# Patient Record
Sex: Male | Born: 1985 | Race: White | Hispanic: No | Marital: Married | Smoking: Never smoker
Health system: Southern US, Community
[De-identification: ages and names within clinical notes are randomized; demographics above are authoritative.]

---

## 2013-02-25 ENCOUNTER — Other Ambulatory Visit: Payer: Self-pay | Admitting: Family Medicine

## 2013-02-25 DIAGNOSIS — E010 Iodine-deficiency related diffuse (endemic) goiter: Secondary | ICD-10-CM

## 2013-03-02 ENCOUNTER — Ambulatory Visit
Admission: RE | Admit: 2013-03-02 | Discharge: 2013-03-02 | Disposition: A | Discharge status: Home / Self Care | Payer: BC Managed Care – PPO | Source: Ambulatory Visit | Attending: Family Medicine | Admitting: Family Medicine

## 2013-03-02 DIAGNOSIS — E010 Iodine-deficiency related diffuse (endemic) goiter: Secondary | ICD-10-CM

## 2015-01-21 IMAGING — US US SOFT TISSUE HEAD/NECK
1 series · 14 of 25 positions shown · non-contrast
Comparison: None.

CLINICAL DATA: Thyromegaly

THYROID ULTRASOUND
TECHNIQUE: Ultrasound examination of the thyroid gland and adjacent
soft tissues was performed.

[Series 1: us soft tissue head/neck · 0.07mm/px · 14 of 32 slices shown]
[im 1/32]
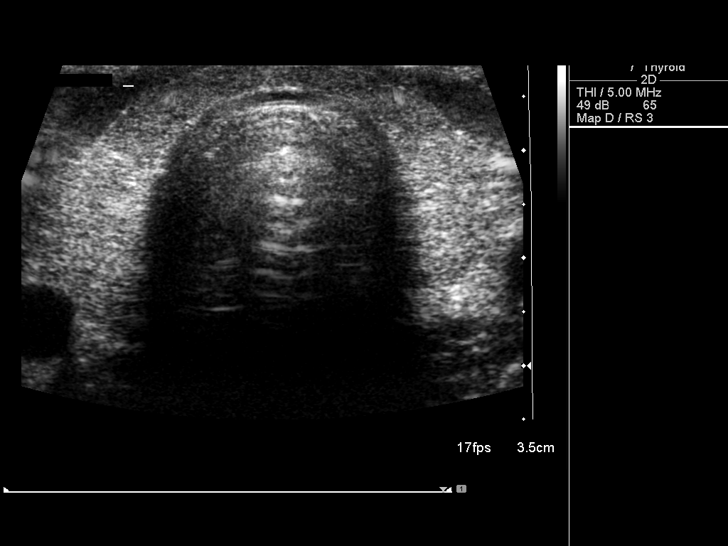
[im 3/32]
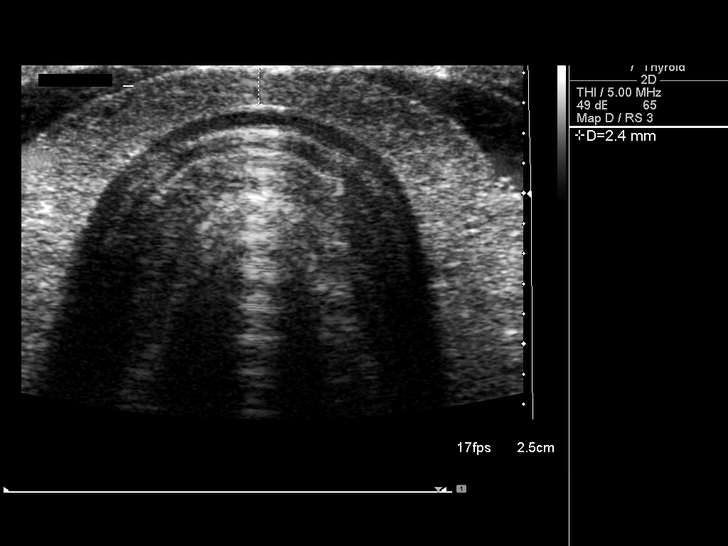
[im 6/32]
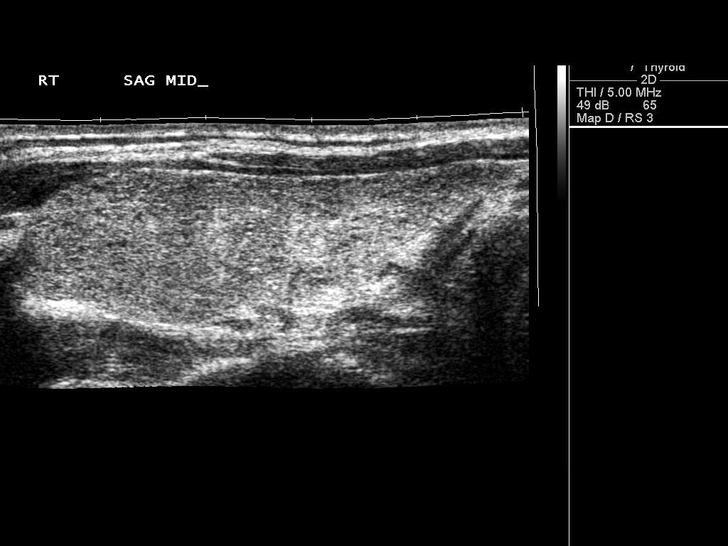
[im 8/32]
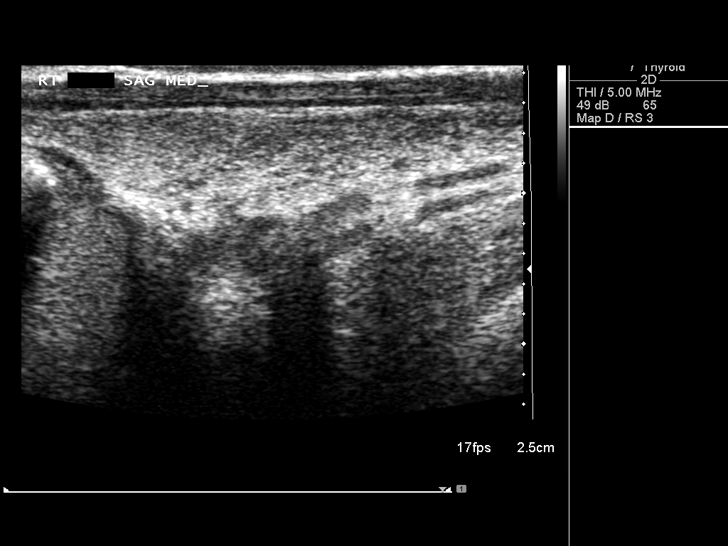
[im 11/32]
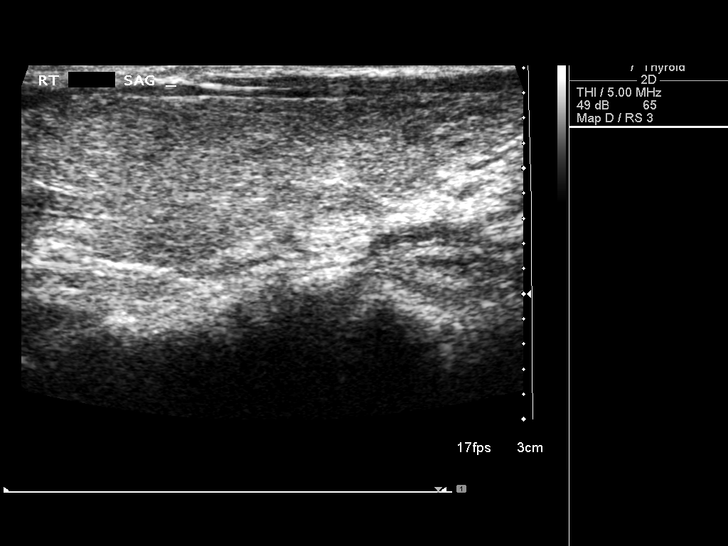
[im 12/32]
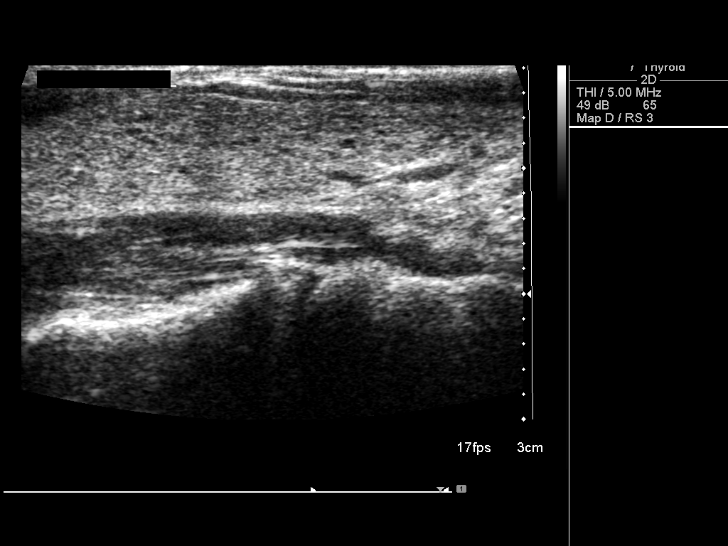
[im 15/32]
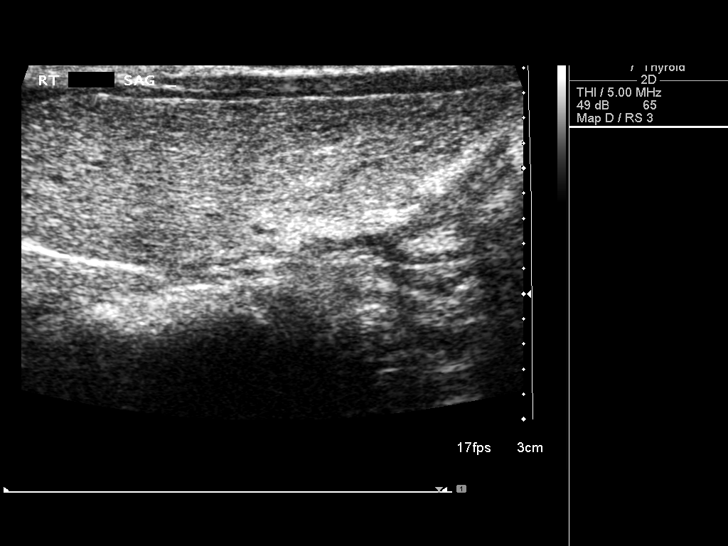
[im 17/32]
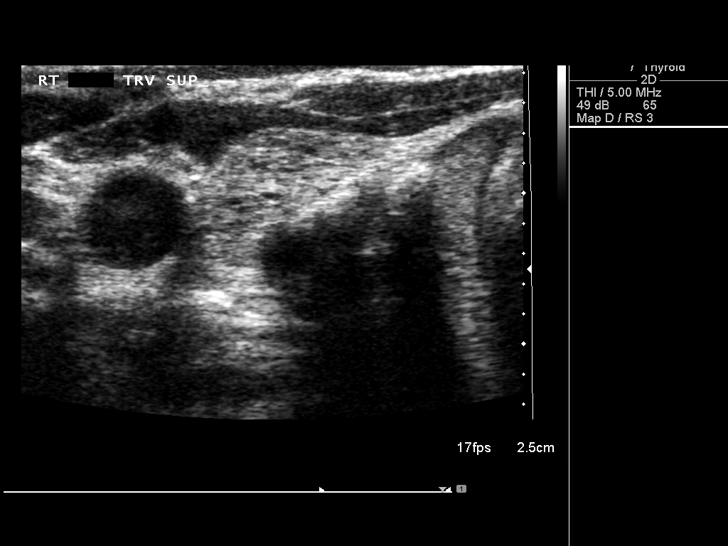
[im 20/32]
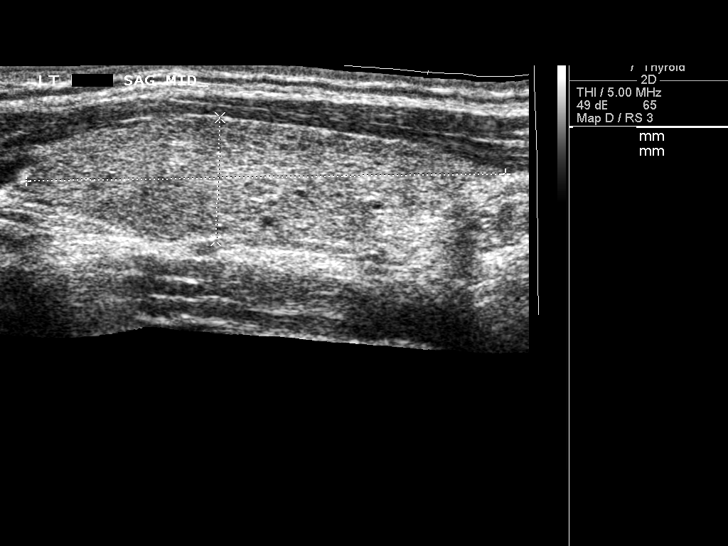
[im 21/32]
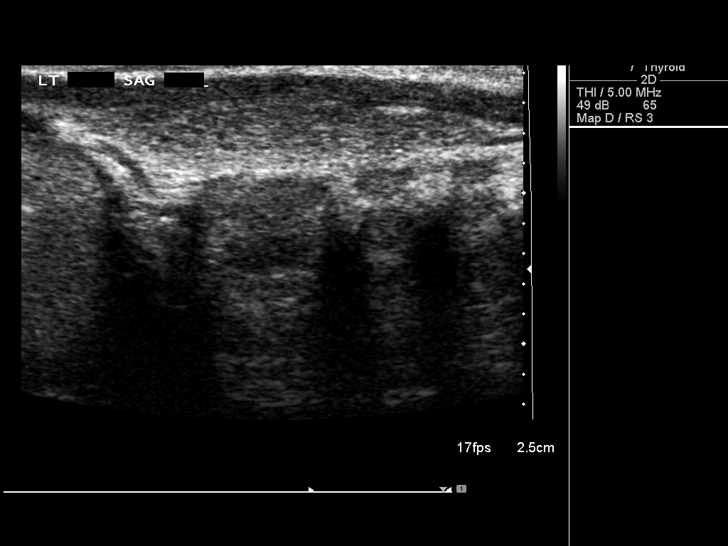
[im 24/32]
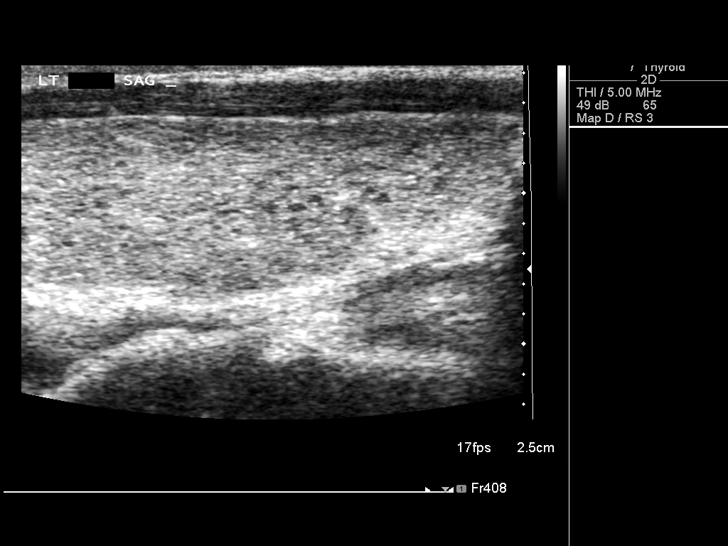
[im 26/32]
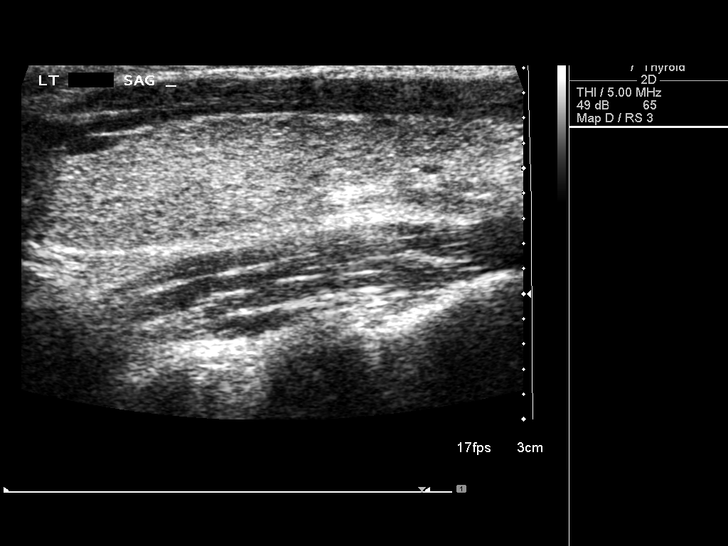
[im 29/32]
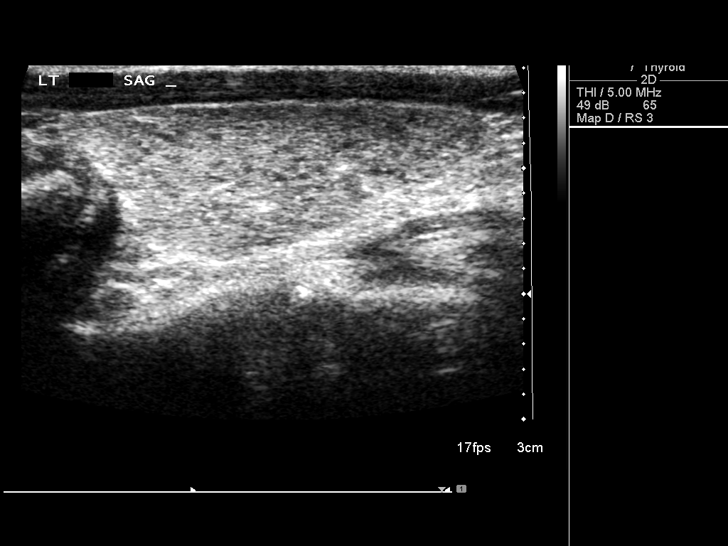
[im 32/32]
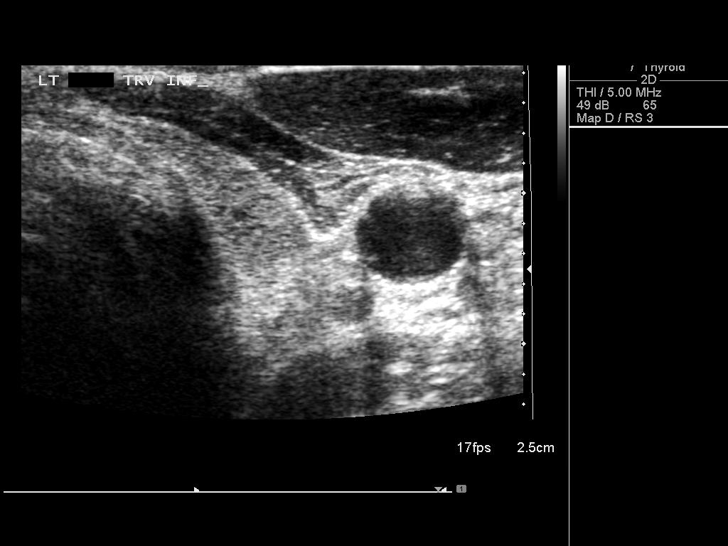

[14 of 25 positions shown; findings below may reference images not displayed]

FINDINGS: Right thyroid lobe:  Normal in size and echogenicity.  The right
lobe measures 4.8 cm x 1.5 cm x 1.6 cm.
Left thyroid lobe:  Normal in size and echogenicity.  The left lobe
measures 4.4 cm x 1.1 cm x 1.5 cm.
Isthmus:  Normal in thickness measuring 2 mm.

Focal nodules:  None

Lymphadenopathy:  None visualized.
IMPRESSION: Normal thyroid ultrasound.

## 2015-06-02 ENCOUNTER — Encounter (HOSPITAL_COMMUNITY): Payer: Self-pay | Admitting: *Deleted

## 2015-06-02 ENCOUNTER — Emergency Department (HOSPITAL_COMMUNITY)
Admission: EM | Admit: 2015-06-02 | Discharge: 2015-06-02 | Disposition: A | Discharge status: Home / Self Care | Payer: BLUE CROSS/BLUE SHIELD | Attending: Emergency Medicine | Admitting: Emergency Medicine

## 2015-06-02 ENCOUNTER — Emergency Department (HOSPITAL_COMMUNITY): Payer: BLUE CROSS/BLUE SHIELD

## 2015-06-02 DIAGNOSIS — K5900 Constipation, unspecified: Secondary | ICD-10-CM

## 2015-06-02 DIAGNOSIS — K602 Anal fissure, unspecified: Secondary | ICD-10-CM | POA: Diagnosis not present

## 2015-06-02 LAB — POC OCCULT BLOOD, ED: Fecal Occult Bld: NEGATIVE

## 2015-06-02 MED ORDER — MILK AND MOLASSES ENEMA
1.0000 | Freq: Once | RECTAL | Status: AC
  Administered 2015-06-02: 250 mL via RECTAL
  Filled 2015-06-02: qty 250

## 2015-06-02 MED ORDER — POLYETHYLENE GLYCOL 3350 17 GM/SCOOP PO POWD: 17.0000 g | Freq: Two times a day (BID) | ORAL | Status: AC

## 2015-06-02 NOTE — Discharge Instructions (Signed)
1. Medications: Miralax, usual home medications 2. Treatment: rest, drink plenty of fluids,  3. Follow Up: Please followup with your primary doctor in 2-3 days for discussion of your diagnoses and further evaluation after today's visit; if you do not have a primary care doctor use the resource guide provided to find one; Please return to the ER for worsening symptoms    Constipation, Adult Constipation is when a person has fewer than three bowel movements a week, has difficulty having a bowel movement, or has stools that are dry, hard, or larger than normal. As people grow older, constipation is more common. A low-fiber diet, not taking in enough fluids, and taking certain medicines may make constipation worse.  CAUSES   Certain medicines, such as antidepressants, pain medicine, iron supplements, antacids, and water pills.   Certain diseases, such as diabetes, irritable bowel syndrome (IBS), thyroid disease, or depression.   Not drinking enough water.   Not eating enough fiber-rich foods.   Stress or travel.   Lack of physical activity or exercise.   Ignoring the urge to have a bowel movement.   Using laxatives too much.  SIGNS AND SYMPTOMS   Having fewer than three bowel movements a week.   Straining to have a bowel movement.   Having stools that are hard, dry, or larger than normal.   Feeling full or bloated.   Pain in the lower abdomen.   Not feeling relief after having a bowel movement.  DIAGNOSIS  Your health care provider will take a medical history and perform a physical exam. Further testing may be done for severe constipation. Some tests may include:  A barium enema X-ray to examine your rectum, colon, and, sometimes, your small intestine.   A sigmoidoscopy to examine your lower colon.   A colonoscopy to examine your entire colon. TREATMENT  Treatment will depend on the severity of your constipation and what is causing it. Some dietary  treatments include drinking more fluids and eating more fiber-rich foods. Lifestyle treatments may include regular exercise. If these diet and lifestyle recommendations do not help, your health care provider may recommend taking over-the-counter laxative medicines to help you have bowel movements. Prescription medicines may be prescribed if over-the-counter medicines do not work.  HOME CARE INSTRUCTIONS   Eat foods that have a lot of fiber, such as fruits, vegetables, whole grains, and beans.  Limit foods high in fat and processed sugars, such as french fries, hamburgers, cookies, candies, and soda.   A fiber supplement may be added to your diet if you cannot get enough fiber from foods.   Drink enough fluids to keep your urine clear or pale yellow.   Exercise regularly or as directed by your health care provider.   Go to the restroom when you have the urge to go. Do not hold it.   Only take over-the-counter or prescription medicines as directed by your health care provider. Do not take other medicines for constipation without talking to your health care provider first.  SEEK IMMEDIATE MEDICAL CARE IF:   You have bright red blood in your stool.   Your constipation lasts for more than 4 days or gets worse.   You have abdominal or rectal pain.   You have thin, pencil-like stools.   You have unexplained weight loss. MAKE SURE YOU:   Understand these instructions.  Will watch your condition.  Will get help right away if you are not doing well or get worse.   This information is  not intended to replace advice given to you by your health care provider. Make sure you discuss any questions you have with your health care provider.   Document Released: 04/12/2004 Document Revised: 08/05/2014 Document Reviewed: 04/26/2013 Elsevier Interactive Patient Education [employer]2016 [employer]vier Inc.

## 2015-06-02 NOTE — ED Notes (Signed)
PA Muthersbaugh at bedside. 

## 2015-06-02 NOTE — ED Notes (Signed)
Patient transported to X-ray 

## 2015-06-02 NOTE — ED Notes (Signed)
Pt reports constipation, last BM two days ago. Has sensation that he needs to have bowel movement but unable and feels like there is "possible obstruction." no relief with stool softener this am.

## 2015-06-02 NOTE — ED Notes (Signed)
Patient advised he could only tolerate part of enema. This RN advised patient to hold content in as long as possible.

## 2015-06-02 NOTE — ED Notes (Signed)
Patient advising he felt like he could tolerate more of the enema at this time.

## 2015-06-02 NOTE — ED Provider Notes (Signed)
CSN: [id number]     Arrival date & time 06/02/15  1212 History   First MD Initiated Contact with Patient 06/02/15 1354     Chief Complaint  Patient presents with  . Constipation     (Consider location/radiation/quality/duration/timing/severity/associated sxs/prior Treatment) Patient is a 29 y.o. male presenting with constipation. The history is provided by the patient and medical records. No language interpreter was used.  Constipation Associated symptoms: no abdominal pain, no back pain, no diarrhea, no dysuria, no fever, no nausea and no vomiting      Randall Coombehillip Bayley is a 29 y.o. male  with no major medical problems presents to the Emergency Department complaining of gradual, persistent, progressively worsening constipation onset 3 days ago. He reports that when he strains for a BM he feels like there is an obstruction.  He reports pain at the anal opening when attempting to bear down.  Pt reports his last BM was 3 days ago.  He reports that BM was hard.  Pt reports he has been traveling and was not constipated while traveling.  Pt reports he was in [place] for 12 days. No N/V/D, fever, chills, headache, neck pain. Nothing makes it worse.  No anal sex, no insertion into the rectum.  Pt reports taking a stool softener this AM without relief.  Pt denies fever, chills, headache, neck pain, chest pain, shortness of breath, nausea, vomiting, diarrhea, weakness, dizziness, syncope.Marland Kitchen.     History reviewed. No pertinent past medical history. History reviewed. No pertinent past surgical history. History reviewed. No pertinent family history. Social History  Substance Use Topics  . Smoking status: Never Smoker   . Smokeless tobacco: None  . Alcohol Use: Yes     Comment: occ    Review of Systems  Constitutional: Negative for fever, diaphoresis, appetite change, fatigue and unexpected weight change.  HENT: Negative for mouth sores.   Eyes: Negative for visual disturbance.  Respiratory: Negative  for cough, chest tightness, shortness of breath and wheezing.   Cardiovascular: Negative for chest pain.  Gastrointestinal: Positive for constipation. Negative for nausea, vomiting, abdominal pain and diarrhea.  Endocrine: Negative for polydipsia, polyphagia and polyuria.  Genitourinary: Negative for dysuria, urgency, frequency and hematuria.  Musculoskeletal: Negative for back pain and neck stiffness.  Skin: Negative for rash.  Allergic/Immunologic: Negative for immunocompromised state.  Neurological: Negative for syncope, light-headedness and headaches.  Hematological: Does not bruise/bleed easily.  Psychiatric/Behavioral: Negative for sleep disturbance. The patient is not nervous/anxious.       Allergies  Review of patient's allergies indicates no known allergies.  Home Medications   Prior to Admission medications   Medication Sig Start Date End Date Taking? Authorizing Provider  ibuprofen (ADVIL,MOTRIN) 400 MG tablet Take 400 mg by mouth every 6 (six) hours as needed for fever or mild pain.   Yes Historical Provider, MD  minocycline (MINOCIN,DYNACIN) 100 MG capsule Take 100 mg by mouth 2 (two) times daily.   Yes Historical Provider, MD  Multiple Vitamins-Minerals (MULTIVITAMIN WITH MINERALS) tablet Take 1 tablet by mouth daily.   Yes Historical Provider, MD  polyethylene glycol powder (GLYCOLAX/MIRALAX) powder Take 17 g by mouth 2 (two) times daily. 06/02/15   Randall Callins, PA-C   BP 97/54 mmHg  Pulse 68  Temp(Src) 98.2 F (36.8 C) (Oral)  Resp 16  SpO2 96% Physical Exam  Constitutional: He appears well-developed and well-nourished. No distress.  Awake, alert, nontoxic appearance  HENT:  Head: Normocephalic and atraumatic.  Mouth/Throat: Oropharynx is clear and moist. No  oropharyngeal exudate.  Eyes: Conjunctivae are normal. No scleral icterus.  Neck: Normal range of motion. Neck supple.  Cardiovascular: Normal rate, regular rhythm, normal heart sounds and intact  distal pulses.   No murmur heard. Pulmonary/Chest: Effort normal and breath sounds normal. No respiratory distress. He has no wheezes.  Equal chest expansion  Abdominal: Soft. Bowel sounds are normal. He exhibits no distension and no mass. There is no tenderness. There is no rebound and no guarding. Hernia confirmed negative in the right inguinal area and confirmed negative in the left inguinal area.  Genitourinary: Testes normal and penis normal. Rectal exam shows fissure. Rectal exam shows no external hemorrhoid and no tenderness. Guaiac negative stool. Prostate is not enlarged and not tender.     Impacted stool in the rectum  Musculoskeletal: Normal range of motion. He exhibits no edema.  Lymphadenopathy:       Right: No inguinal adenopathy present.       Left: No inguinal adenopathy present.  Neurological: He is alert.  Speech is clear and goal oriented Moves extremities without ataxia  Skin: Skin is warm and dry. He is not diaphoretic.  Psychiatric: He has a normal mood and affect.  Nursing note and vitals reviewed.   ED Course  Fecal disimpaction Date/Time: 06/02/2015 3:06 PM Performed by: Dierdre Forth Authorized by: Dierdre Forth Consent: Verbal consent obtained. Risks and benefits: risks, benefits and alternatives were discussed Consent given by: patient Patient understanding: patient states understanding of the procedure being performed Patient consent: the patient's understanding of the procedure matches consent given Procedure consent: procedure consent matches procedure scheduled Relevant documents: relevant documents present and verified Site marked: the operative site was marked Required items: required blood products, implants, devices, and special equipment available Patient identity confirmed: verbally with patient and arm band Time out: Immediately prior to procedure a "time out" was called to verify the correct patient, procedure, equipment,  support staff and site/side marked as required. Preparation: Patient was prepped and draped in the usual sterile fashion. Local anesthesia used: no Patient sedated: no Patient tolerance: Patient tolerated the procedure well with no immediate complications Comments: Small amount of stool removed.     (including critical care time) Labs Review Labs Reviewed  POC OCCULT BLOOD, ED    Imaging Review Dg Abd Acute W/chest  06/02/2015  CLINICAL DATA:  Constipation, no bowel movement for 2 days. EXAM: DG ABDOMEN ACUTE W/ 1V CHEST COMPARISON:  None. FINDINGS: Normal heart size and vascularity. Lungs remain clear. No focal pneumonia, collapse or consolidation. No effusion or pneumothorax. Trachea is midline. No acute osseous finding. Negative for free air. Large volume of retained stool in the left colon and rectum. No significant obstruction pattern or ileus. No abnormal calcification or osseous finding. IMPRESSION: Large volume retained stool in the left colon and rectum compatible with constipation. No other acute process. Electronically Signed   By: Judie Petit.  Shick M.D.   On: 06/02/2015 15:23   I have personally reviewed and evaluated these images and lab results as part of my medical decision-making.   EKG Interpretation None      MDM   Final diagnoses:  Constipation, unspecified constipation type  Anal fissure   Randall Nichols presents with constipation x 3 days.  Impacted stool in the rectum. Small amount removed with fecal disimpaction.   3:29 PM Acute abdominal series with large-volume retained stool in the left colon and rectum consistent with constipation. No evidence of bowel obstruction. Abdomen is soft on exam without guarding  or rebound.   4:28PM Pt given enema and will plan for d/c home with miralax.  Discussed bowel hygiene and conservative therapies for constipation.      Dahlia Client Simora Dingee, PA-C 06/03/15 0730  Azalia Bilis, MD 06/08/15 [phone]

## 2015-06-02 NOTE — ED Notes (Signed)
Patient reporting large bowel movement, stating he feels relief from the enema.

## 2015-06-05 ENCOUNTER — Emergency Department (INDEPENDENT_AMBULATORY_CARE_PROVIDER_SITE_OTHER)
Admission: EM | Admit: 2015-06-05 | Discharge: 2015-06-05 | Disposition: A | Discharge status: Home / Self Care | Payer: BLUE CROSS/BLUE SHIELD | Source: Home / Self Care | Attending: Family Medicine | Admitting: Family Medicine

## 2015-06-05 ENCOUNTER — Encounter (HOSPITAL_COMMUNITY): Payer: Self-pay | Admitting: *Deleted

## 2015-06-05 DIAGNOSIS — K5901 Slow transit constipation: Secondary | ICD-10-CM

## 2015-06-05 MED ORDER — PEG 3350-KCL-NABCB-NACL-NASULF 236 G PO SOLR: 4000.0000 mL | Freq: Once | ORAL | Status: AC

## 2015-06-05 NOTE — ED Provider Notes (Signed)
CSN: [id number]     Arrival date & time 06/05/15  1300 History   First MD Initiated Contact with Patient 06/05/15 1310     Chief Complaint  Patient presents with  . Constipation   (Consider location/radiation/quality/duration/timing/severity/associated sxs/prior Treatment) Patient is a 29 y.o. male presenting with constipation. The history is provided by the patient.  Constipation Severity:  Moderate Time since last bowel movement:  3 days Progression:  Unchanged Chronicity:  New Context comment:  Seen in ER 11/5 and eval for same, no resolution after care, here for further rx. Associated symptoms: no nausea and no vomiting     History reviewed. No pertinent past medical history. History reviewed. No pertinent past surgical history. History reviewed. No pertinent family history. Social History  Substance Use Topics  . Smoking status: Never Smoker   . Smokeless tobacco: None  . Alcohol Use: Yes     Comment: occ    Review of Systems  Constitutional: Positive for appetite change.       Nothing to eat for 18hrs.  Gastrointestinal: Positive for constipation and blood in stool. Negative for nausea and vomiting.       Min after ER procedure  All other systems reviewed and are negative.   Allergies  Review of patient's allergies indicates no known allergies.  Home Medications   Prior to Admission medications   Medication Sig Start Date End Date Taking? Authorizing Provider  ibuprofen (ADVIL,MOTRIN) 400 MG tablet Take 400 mg by mouth every 6 (six) hours as needed for fever or mild pain.    Historical Provider, MD  minocycline (MINOCIN,DYNACIN) 100 MG capsule Take 100 mg by mouth 2 (two) times daily.    Historical Provider, MD  Multiple Vitamins-Minerals (MULTIVITAMIN WITH MINERALS) tablet Take 1 tablet by mouth daily.    Historical Provider, MD  polyethylene glycol (GOLYTELY) 236 G solution Take 4,000 mLs by mouth once. 06/05/15   Linna HoffJames D Kindl, MD  polyethylene glycol powder  (GLYCOLAX/MIRALAX) powder Take 17 g by mouth 2 (two) times daily. 06/02/15   Hannah Muthersbaugh, PA-C   Meds Ordered and Administered this Visit  Medications - No data to display  BP 129/80 mmHg  Pulse 57  Temp(Src) 97.6 F (36.4 C) (Oral)  Resp 16  SpO2 98% No data found.   Physical Exam  Constitutional: He is oriented to person, place, and time. He appears well-developed and well-nourished. No distress.  Neck: Normal range of motion. Neck supple.  Cardiovascular: Normal heart sounds.   Pulmonary/Chest: Effort normal and breath sounds normal.  Abdominal: Soft. Bowel sounds are normal. He exhibits no distension and no mass. There is no tenderness. There is no rebound and no guarding.  Lymphadenopathy:    He has no cervical adenopathy.  Neurological: He is alert and oriented to person, place, and time.  Skin: Skin is warm and dry.  Nursing note and vitals reviewed.   ED Course  Procedures (including critical care time)  Labs Review Labs Reviewed - No data to display  Imaging Review No results found.   Visual Acuity Review  Right Eye Distance:   Left Eye Distance:   Bilateral Distance:    Right Eye Near:   Left Eye Near:    Bilateral Near:         MDM   1. Constipation by delayed colonic transit        Linna HoffJames D Kindl, MD 06/05/15 1331

## 2015-06-05 NOTE — Discharge Instructions (Signed)
Take all of medicine and stat on high fiber diet and lots of fluids.

## 2015-06-05 NOTE — ED Notes (Signed)
Pt  Has    questians      About plan of  Care   Dr  Zollie Scalekindl  Spoke  With pt

## 2015-06-05 NOTE — ED Notes (Signed)
Pt  Reports   Was   Seen  Er     3  Days  Ago for  Constipation          He  States  At that time  He  Was  Given  An  Enema      And   Got  Some  Releif  He  States  He  Has  Not  Had  A   bm  Since  And  He   Has   Had  Had  Some  abd  Pain          denys  Any vomiting

## 2016-01-02 DIAGNOSIS — L718 Other rosacea: Secondary | ICD-10-CM | POA: Diagnosis not present

## 2016-01-02 DIAGNOSIS — D229 Melanocytic nevi, unspecified: Secondary | ICD-10-CM | POA: Diagnosis not present

## 2016-01-02 DIAGNOSIS — L814 Other melanin hyperpigmentation: Secondary | ICD-10-CM | POA: Diagnosis not present

## 2016-09-09 DIAGNOSIS — D1723 Benign lipomatous neoplasm of skin and subcutaneous tissue of right leg: Secondary | ICD-10-CM | POA: Diagnosis not present

## 2016-09-09 DIAGNOSIS — Z0000 Encounter for general adult medical examination without abnormal findings: Secondary | ICD-10-CM | POA: Diagnosis not present

## 2016-10-04 DIAGNOSIS — Z131 Encounter for screening for diabetes mellitus: Secondary | ICD-10-CM | POA: Diagnosis not present

## 2016-12-31 DIAGNOSIS — L719 Rosacea, unspecified: Secondary | ICD-10-CM | POA: Diagnosis not present

## 2017-04-04 DIAGNOSIS — N50811 Right testicular pain: Secondary | ICD-10-CM | POA: Diagnosis not present

## 2017-04-04 DIAGNOSIS — N50819 Testicular pain, unspecified: Secondary | ICD-10-CM | POA: Diagnosis not present

## 2017-04-22 IMAGING — DX DG ABDOMEN ACUTE W/ 1V CHEST
3 series · 3 of 3 positions shown · non-contrast
Comparison: None.

CLINICAL DATA: Constipation, no bowel movement for 2 days.

EXAM:
DG ABDOMEN ACUTE W/ 1V CHEST

[chest pa]
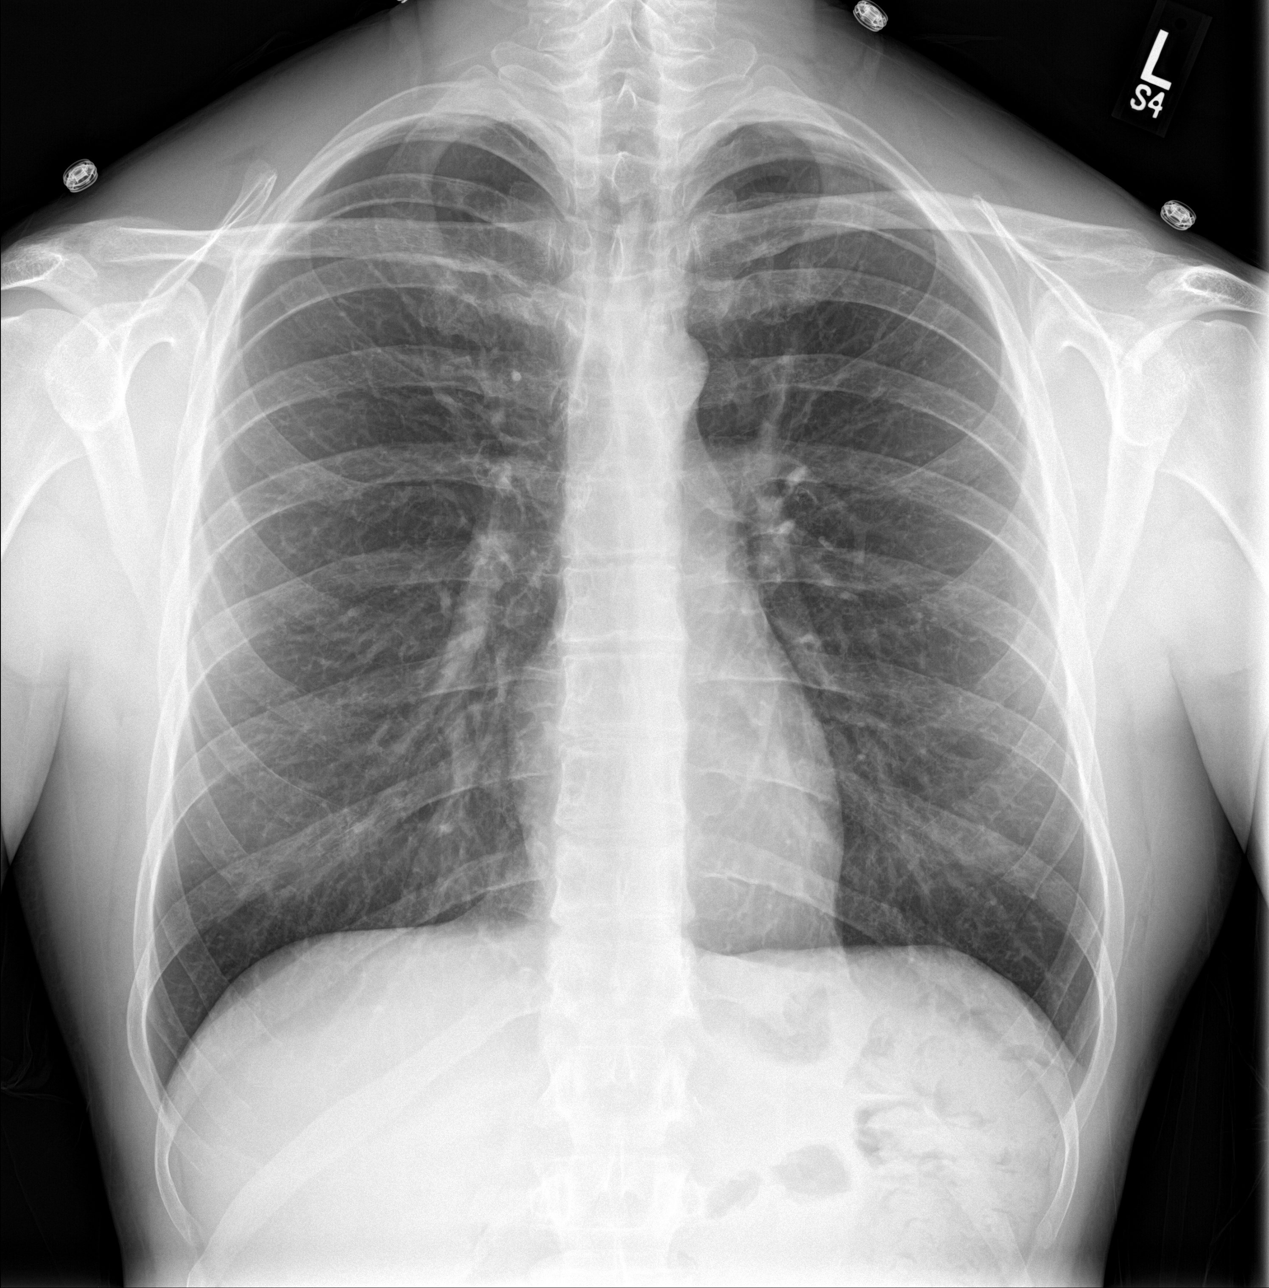

[abdomen erect]
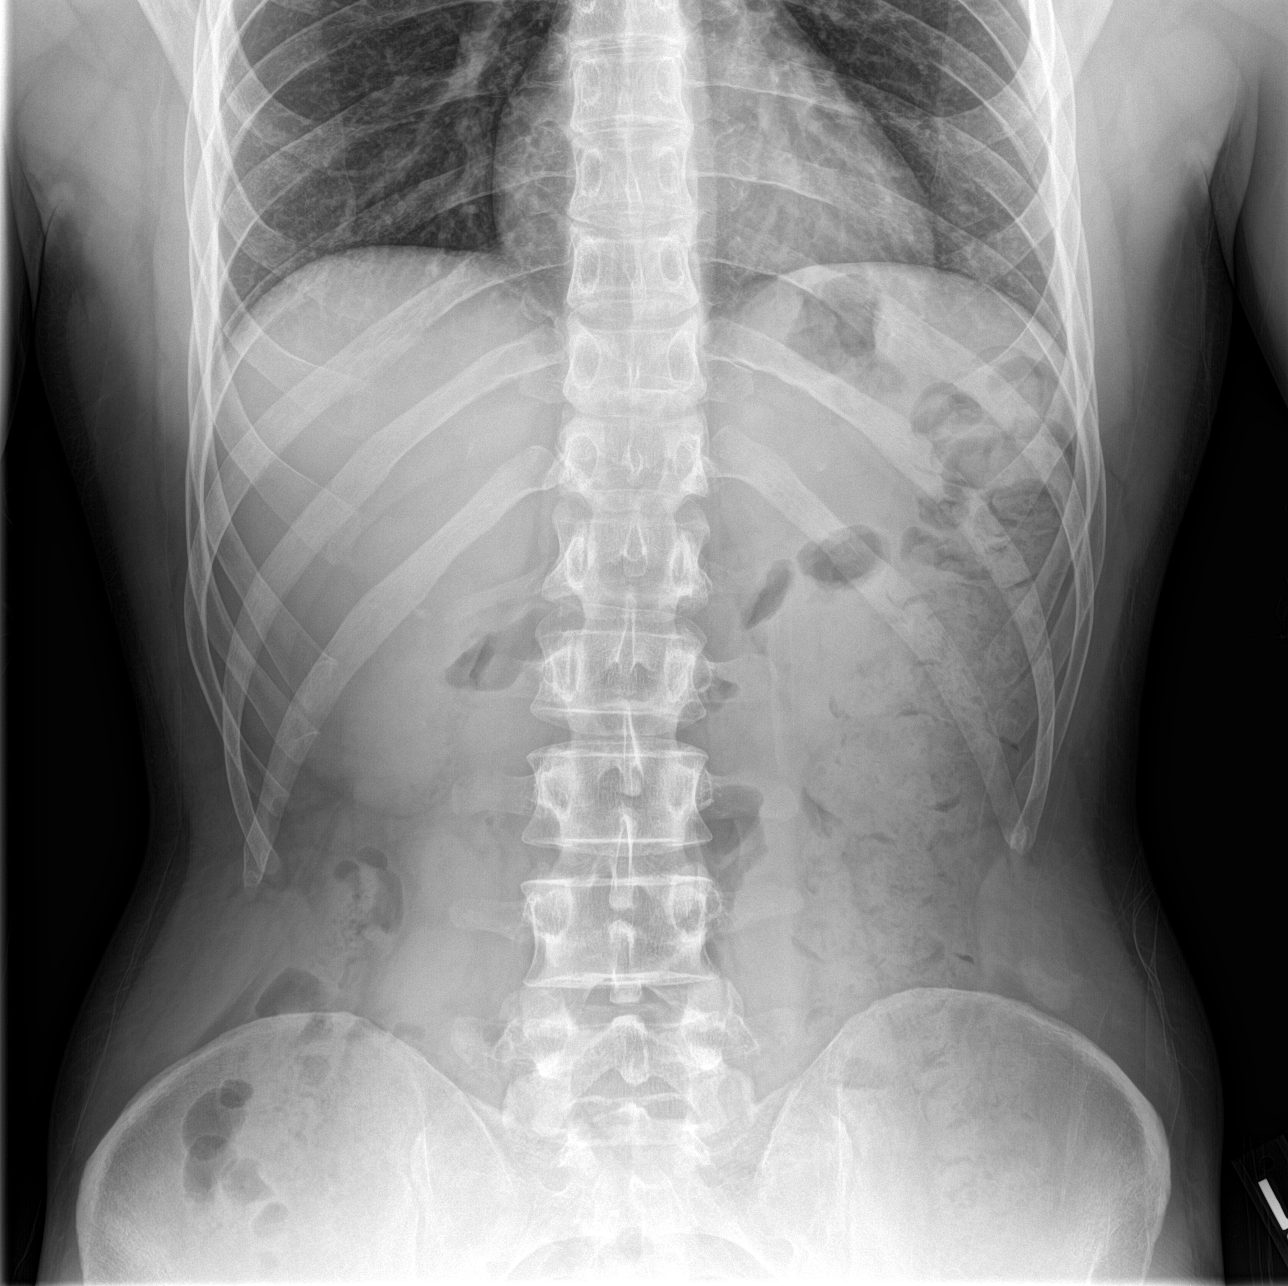

[abdomen supine]
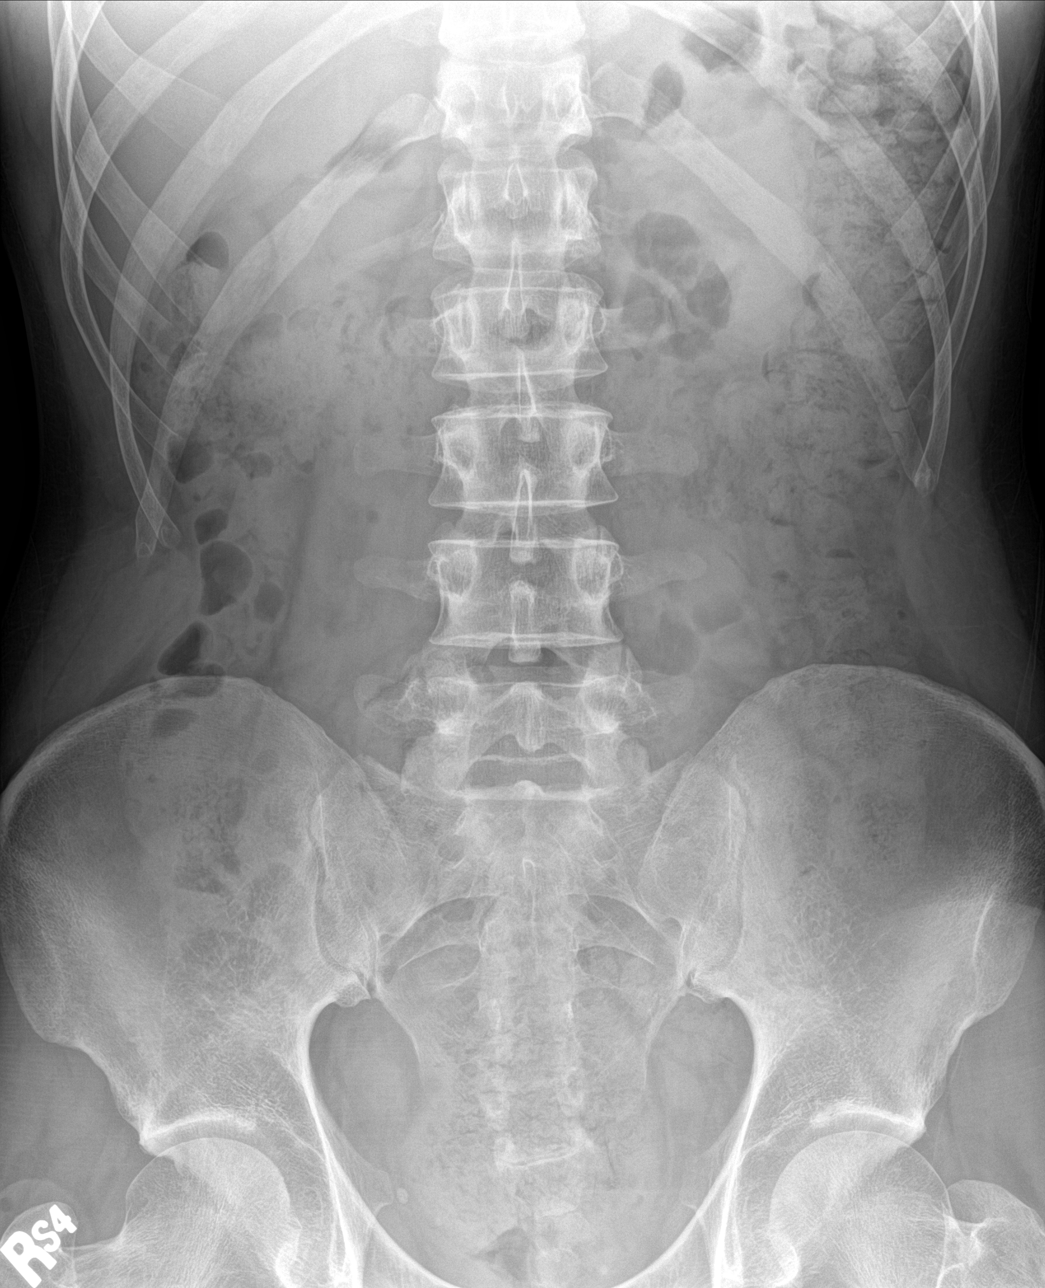

[3 of 3 positions shown; findings below may reference images not displayed]

FINDINGS: Normal heart size and vascularity. Lungs remain clear. No focal
pneumonia, collapse or consolidation. No effusion or pneumothorax.
Trachea is midline. No acute osseous finding.

Negative for free air. Large volume of retained stool in the left
colon and rectum. No significant obstruction pattern or ileus. No
abnormal calcification or osseous finding.
IMPRESSION: Large volume retained stool in the left colon and rectum compatible
with constipation. No other acute process.

## 2017-05-20 DIAGNOSIS — R109 Unspecified abdominal pain: Secondary | ICD-10-CM | POA: Diagnosis not present

## 2017-05-20 DIAGNOSIS — N50819 Testicular pain, unspecified: Secondary | ICD-10-CM | POA: Diagnosis not present

## 2017-09-11 DIAGNOSIS — Z0000 Encounter for general adult medical examination without abnormal findings: Secondary | ICD-10-CM | POA: Diagnosis not present

## 2017-10-01 DIAGNOSIS — Z13220 Encounter for screening for lipoid disorders: Secondary | ICD-10-CM | POA: Diagnosis not present

## 2017-10-01 DIAGNOSIS — Z0000 Encounter for general adult medical examination without abnormal findings: Secondary | ICD-10-CM | POA: Diagnosis not present

## 2018-01-08 DIAGNOSIS — L719 Rosacea, unspecified: Secondary | ICD-10-CM | POA: Diagnosis not present

## 2018-01-08 DIAGNOSIS — L814 Other melanin hyperpigmentation: Secondary | ICD-10-CM | POA: Diagnosis not present

## 2018-01-08 DIAGNOSIS — D229 Melanocytic nevi, unspecified: Secondary | ICD-10-CM | POA: Diagnosis not present

## 2018-08-03 DIAGNOSIS — B399 Histoplasmosis, unspecified: Secondary | ICD-10-CM | POA: Diagnosis not present

## 2019-05-25 DIAGNOSIS — D229 Melanocytic nevi, unspecified: Secondary | ICD-10-CM | POA: Diagnosis not present

## 2019-05-25 DIAGNOSIS — Z1283 Encounter for screening for malignant neoplasm of skin: Secondary | ICD-10-CM | POA: Diagnosis not present

## 2020-06-13 DIAGNOSIS — L918 Other hypertrophic disorders of the skin: Secondary | ICD-10-CM | POA: Diagnosis not present

## 2020-06-13 DIAGNOSIS — D1801 Hemangioma of skin and subcutaneous tissue: Secondary | ICD-10-CM | POA: Diagnosis not present

## 2020-06-13 DIAGNOSIS — Z1283 Encounter for screening for malignant neoplasm of skin: Secondary | ICD-10-CM | POA: Diagnosis not present

## 2020-06-27 DIAGNOSIS — Z23 Encounter for immunization: Secondary | ICD-10-CM | POA: Diagnosis not present

## 2020-06-27 DIAGNOSIS — Z0000 Encounter for general adult medical examination without abnormal findings: Secondary | ICD-10-CM | POA: Diagnosis not present

## 2020-06-27 DIAGNOSIS — R7989 Other specified abnormal findings of blood chemistry: Secondary | ICD-10-CM | POA: Diagnosis not present

## 2020-07-03 DIAGNOSIS — R7989 Other specified abnormal findings of blood chemistry: Secondary | ICD-10-CM | POA: Diagnosis not present

## 2020-08-29 DIAGNOSIS — Z131 Encounter for screening for diabetes mellitus: Secondary | ICD-10-CM | POA: Diagnosis not present

## 2020-08-29 DIAGNOSIS — Z13220 Encounter for screening for lipoid disorders: Secondary | ICD-10-CM | POA: Diagnosis not present

## 2020-08-29 DIAGNOSIS — R7989 Other specified abnormal findings of blood chemistry: Secondary | ICD-10-CM | POA: Diagnosis not present

## 2020-11-14 ENCOUNTER — Ambulatory Visit (HOSPITAL_COMMUNITY)
Admission: EM | Admit: 2020-11-14 | Discharge: 2020-11-14 | Disposition: A | Discharge status: Home / Self Care | Payer: BC Managed Care – PPO | Attending: Family Medicine | Admitting: Family Medicine

## 2020-11-14 ENCOUNTER — Encounter (HOSPITAL_COMMUNITY): Payer: Self-pay | Admitting: Emergency Medicine

## 2020-11-14 ENCOUNTER — Other Ambulatory Visit: Payer: Self-pay

## 2020-11-14 DIAGNOSIS — S81811A Laceration without foreign body, right lower leg, initial encounter: Secondary | ICD-10-CM | POA: Diagnosis not present

## 2020-11-14 NOTE — ED Triage Notes (Signed)
Laceration to right lower, anterior leg.  Stepped on a piece of playground equipment, foot slipped and injured leg.  Incident occurred today

## 2020-11-15 NOTE — ED Provider Notes (Signed)
  [facility]   [id number] 11/14/20 Arrival Time: 1948  ASSESSMENT & PLAN:  1. Leg laceration, right, initial encounter     Procedure: Verbal consent obtained. Patient provided with risks and alternatives to the procedure. Wound copiously irrigated with NS then cleansed with betadine. Local anesthesia: Lidocaine 1% with epinephrine. Wound carefully explored. No foreign body, tendon injury, or nonviable tissue were noted. Using sterile technique, 4 interrupted 4-0 Prolene sutures were placed to reapproximate the wound. Procedure tolerated well. No complications. Minimal bleeding. Advised to look for and return for any signs of infection such as redness, swelling, discharge, or worsening pain. Return for suture removal in 7-10 days.  See AVS for d/c information.  Reviewed expectations re: course of current medical issues. Questions answered. Outlined signs and symptoms indicating need for more acute intervention. Patient verbalized understanding. After Visit Summary given.   SUBJECTIVE:  Randall Nichols is a 35 y.o. male who presents with a laceration of anterior RLE. Today. Hit against metal playground equip. Bleeding controlled. No extremity sensation changes or weakness. Ambulatory without difficulty. Td UTD: Yes.  OBJECTIVE:  Vitals:   11/14/20 2030  BP: 128/74  Pulse: (!) 53  Resp: 18  Temp: 97.8 F (36.6 C)  TempSrc: Oral  SpO2: 98%     General appearance: alert; no distress Skin: linear laceration of anterior lower RLE; size: approx 2 cm; clean wound edges, no foreign bodies; without active bleeding Psychological: alert and cooperative; normal mood and affect   No Known Allergies  History reviewed. No pertinent past medical history. Social History   Socioeconomic History  . Marital status: Married    Spouse name: Not on file  . Number of children: Not on file  . Years of education: Not on file  . Highest education level: Not on file  Occupational  History  . Not on file  Tobacco Use  . Smoking status: Never Smoker  . Smokeless tobacco: Never Used  Vaping Use  . Vaping Use: Never used  Substance and Sexual Activity  . Alcohol use: Yes    Comment: occ  . Drug use: No  . Sexual activity: Not on file  Other Topics Concern  . Not on file  Social History Narrative  . Not on file   Social Determinants of Health   Financial Resource Strain: Not on file  Food Insecurity: Not on file  Transportation Needs: Not on file  Physical Activity: Not on file  Stress: Not on file  Social Connections: Not on file         Mardella Layman, MD 11/15/20 [phone]

## 2020-11-22 ENCOUNTER — Encounter (HOSPITAL_COMMUNITY): Payer: Self-pay

## 2020-11-22 ENCOUNTER — Other Ambulatory Visit: Payer: Self-pay

## 2020-11-22 ENCOUNTER — Ambulatory Visit (HOSPITAL_COMMUNITY)
Admission: RE | Admit: 2020-11-22 | Discharge: 2020-11-22 | Disposition: A | Discharge status: Home / Self Care | Payer: BC Managed Care – PPO | Source: Ambulatory Visit

## 2020-11-22 NOTE — ED Notes (Signed)
Wound evaluated by Dr. Tracie Harrier before suture removal.

## 2020-11-22 NOTE — ED Triage Notes (Signed)
Pt presents for suture removal in the right lower leg. 4 suture were placed 1 week ago. Pt reports some tightness and redness around the wound.  Pt reports hew had fever the day after the suture, he think can be related to a "cold" as his wife was sick.

## 2020-12-25 DIAGNOSIS — J329 Chronic sinusitis, unspecified: Secondary | ICD-10-CM | POA: Diagnosis not present

## 2020-12-25 DIAGNOSIS — J3489 Other specified disorders of nose and nasal sinuses: Secondary | ICD-10-CM | POA: Diagnosis not present

## 2020-12-25 DIAGNOSIS — Z20822 Contact with and (suspected) exposure to covid-19: Secondary | ICD-10-CM | POA: Diagnosis not present

## 2020-12-25 DIAGNOSIS — R509 Fever, unspecified: Secondary | ICD-10-CM | POA: Diagnosis not present

## 2020-12-25 DIAGNOSIS — R059 Cough, unspecified: Secondary | ICD-10-CM | POA: Diagnosis not present

## 2020-12-25 DIAGNOSIS — Z1152 Encounter for screening for COVID-19: Secondary | ICD-10-CM | POA: Diagnosis not present

## 2021-06-12 DIAGNOSIS — L821 Other seborrheic keratosis: Secondary | ICD-10-CM | POA: Diagnosis not present

## 2021-06-12 DIAGNOSIS — L814 Other melanin hyperpigmentation: Secondary | ICD-10-CM | POA: Diagnosis not present

## 2021-06-12 DIAGNOSIS — Z1283 Encounter for screening for malignant neoplasm of skin: Secondary | ICD-10-CM | POA: Diagnosis not present

## 2021-06-15 DIAGNOSIS — Z23 Encounter for immunization: Secondary | ICD-10-CM | POA: Diagnosis not present

## 2021-07-17 DIAGNOSIS — Z13220 Encounter for screening for lipoid disorders: Secondary | ICD-10-CM | POA: Diagnosis not present

## 2021-07-17 DIAGNOSIS — Z0000 Encounter for general adult medical examination without abnormal findings: Secondary | ICD-10-CM | POA: Diagnosis not present

## 2021-08-23 DIAGNOSIS — R7989 Other specified abnormal findings of blood chemistry: Secondary | ICD-10-CM | POA: Diagnosis not present

## 2021-08-23 DIAGNOSIS — E291 Testicular hypofunction: Secondary | ICD-10-CM | POA: Diagnosis not present

## 2021-09-18 DIAGNOSIS — J019 Acute sinusitis, unspecified: Secondary | ICD-10-CM | POA: Diagnosis not present

## 2022-06-13 DIAGNOSIS — L821 Other seborrheic keratosis: Secondary | ICD-10-CM | POA: Diagnosis not present

## 2022-06-13 DIAGNOSIS — L814 Other melanin hyperpigmentation: Secondary | ICD-10-CM | POA: Diagnosis not present

## 2022-06-13 DIAGNOSIS — Z1283 Encounter for screening for malignant neoplasm of skin: Secondary | ICD-10-CM | POA: Diagnosis not present

## 2022-08-07 DIAGNOSIS — Z0000 Encounter for general adult medical examination without abnormal findings: Secondary | ICD-10-CM | POA: Diagnosis not present

## 2022-08-07 DIAGNOSIS — R7989 Other specified abnormal findings of blood chemistry: Secondary | ICD-10-CM | POA: Diagnosis not present

## 2022-08-07 DIAGNOSIS — Z13220 Encounter for screening for lipoid disorders: Secondary | ICD-10-CM | POA: Diagnosis not present

## 2022-08-07 DIAGNOSIS — E291 Testicular hypofunction: Secondary | ICD-10-CM | POA: Diagnosis not present

## 2022-11-19 DIAGNOSIS — Z1283 Encounter for screening for malignant neoplasm of skin: Secondary | ICD-10-CM | POA: Diagnosis not present

## 2022-11-19 DIAGNOSIS — L728 Other follicular cysts of the skin and subcutaneous tissue: Secondary | ICD-10-CM | POA: Diagnosis not present

## 2022-11-19 DIAGNOSIS — L821 Other seborrheic keratosis: Secondary | ICD-10-CM | POA: Diagnosis not present

## 2022-11-19 DIAGNOSIS — D229 Melanocytic nevi, unspecified: Secondary | ICD-10-CM | POA: Diagnosis not present

## 2023-03-14 DIAGNOSIS — R109 Unspecified abdominal pain: Secondary | ICD-10-CM | POA: Diagnosis not present

## 2023-05-20 DIAGNOSIS — K648 Other hemorrhoids: Secondary | ICD-10-CM | POA: Diagnosis not present

## 2023-08-12 DIAGNOSIS — L700 Acne vulgaris: Secondary | ICD-10-CM | POA: Diagnosis not present

## 2023-08-14 DIAGNOSIS — Z13220 Encounter for screening for lipoid disorders: Secondary | ICD-10-CM | POA: Diagnosis not present

## 2023-08-14 DIAGNOSIS — K648 Other hemorrhoids: Secondary | ICD-10-CM | POA: Diagnosis not present

## 2023-08-14 DIAGNOSIS — E291 Testicular hypofunction: Secondary | ICD-10-CM | POA: Diagnosis not present

## 2023-08-14 DIAGNOSIS — D696 Thrombocytopenia, unspecified: Secondary | ICD-10-CM | POA: Diagnosis not present

## 2023-08-14 DIAGNOSIS — Z0000 Encounter for general adult medical examination without abnormal findings: Secondary | ICD-10-CM | POA: Diagnosis not present

## 2023-08-22 DIAGNOSIS — D696 Thrombocytopenia, unspecified: Secondary | ICD-10-CM | POA: Diagnosis not present

## 2023-08-22 DIAGNOSIS — K921 Melena: Secondary | ICD-10-CM | POA: Diagnosis not present

## 2023-08-27 DIAGNOSIS — R7989 Other specified abnormal findings of blood chemistry: Secondary | ICD-10-CM | POA: Diagnosis not present

## 2023-08-27 DIAGNOSIS — Z23 Encounter for immunization: Secondary | ICD-10-CM | POA: Diagnosis not present

## 2023-08-27 DIAGNOSIS — E291 Testicular hypofunction: Secondary | ICD-10-CM | POA: Diagnosis not present

## 2023-10-14 DIAGNOSIS — K921 Melena: Secondary | ICD-10-CM | POA: Diagnosis not present

## 2023-10-14 DIAGNOSIS — D123 Benign neoplasm of transverse colon: Secondary | ICD-10-CM | POA: Diagnosis not present

## 2023-10-14 DIAGNOSIS — K6289 Other specified diseases of anus and rectum: Secondary | ICD-10-CM | POA: Diagnosis not present

## 2024-03-09 DIAGNOSIS — E611 Iron deficiency: Secondary | ICD-10-CM | POA: Diagnosis not present

## 2024-03-09 DIAGNOSIS — E559 Vitamin D deficiency, unspecified: Secondary | ICD-10-CM | POA: Diagnosis not present

## 2024-03-09 DIAGNOSIS — E291 Testicular hypofunction: Secondary | ICD-10-CM | POA: Diagnosis not present

## 2024-03-09 DIAGNOSIS — E538 Deficiency of other specified B group vitamins: Secondary | ICD-10-CM | POA: Diagnosis not present

## 2024-03-15 DIAGNOSIS — H31002 Unspecified chorioretinal scars, left eye: Secondary | ICD-10-CM | POA: Diagnosis not present

## 2024-08-27 ENCOUNTER — Emergency Department (HOSPITAL_COMMUNITY)
Admission: EM | Admit: 2024-08-27 | Discharge: 2024-08-27 | Disposition: A | Discharge status: Home / Self Care | Attending: Emergency Medicine | Admitting: Emergency Medicine

## 2024-08-27 ENCOUNTER — Other Ambulatory Visit: Payer: Self-pay

## 2024-08-27 ENCOUNTER — Emergency Department (HOSPITAL_COMMUNITY)

## 2024-08-27 DIAGNOSIS — R001 Bradycardia, unspecified: Secondary | ICD-10-CM | POA: Insufficient documentation

## 2024-08-27 DIAGNOSIS — R002 Palpitations: Secondary | ICD-10-CM | POA: Insufficient documentation

## 2024-08-27 LAB — CBC
HCT: 42.6 % (ref 39.0–52.0)
Hemoglobin: 14.7 g/dL (ref 13.0–17.0)
MCH: 33 pg (ref 26.0–34.0)
MCHC: 34.5 g/dL (ref 30.0–36.0)
MCV: 95.5 fL (ref 80.0–100.0)
Platelets: 138 10*3/uL — ABNORMAL LOW (ref 150–400)
RBC: 4.46 MIL/uL (ref 4.22–5.81)
RDW: 12.2 % (ref 11.5–15.5)
WBC: 7.2 10*3/uL (ref 4.0–10.5)
nRBC: 0 % (ref 0.0–0.2)

## 2024-08-27 LAB — BASIC METABOLIC PANEL WITH GFR
Anion gap: 10 (ref 5–15)
BUN: 17 mg/dL (ref 6–20)
CO2: 28 mmol/L (ref 22–32)
Calcium: 9.6 mg/dL (ref 8.9–10.3)
Chloride: 105 mmol/L (ref 98–111)
Creatinine, Ser: 0.95 mg/dL (ref 0.61–1.24)
GFR, Estimated: >60 mL/min
Glucose, Bld: 100 mg/dL — ABNORMAL HIGH (ref 70–99)
Potassium: 3.6 mmol/L (ref 3.5–5.1)
Sodium: 143 mmol/L (ref 135–145)

## 2024-08-27 LAB — TSH: TSH: 3.09 u[IU]/mL (ref 0.350–4.500)

## 2024-08-27 LAB — TROPONIN T, HIGH SENSITIVITY
Troponin T High Sensitivity: 8 ng/L (ref 0–19)
Troponin T High Sensitivity: 7 ng/L (ref 0–19)

## 2024-08-27 LAB — MAGNESIUM: Magnesium: 2.2 mg/dL (ref 1.7–2.4)

## 2024-08-27 NOTE — ED Provider Triage Note (Signed)
 Emergency Medicine Provider Triage Evaluation Note  Randall Nichols , a 39 y.o. male  was evaluated in triage.  Pt complains of palpitations. Reports he feels his heart beating irregularly, reports he has felt this before but never for this long.  Reports some dyspnea when the palpitations are especially strong, none currently.  Reports that he feels his heart skip a beat approximately every 20 beats.  Denies any chest pain, no nausea or vomiting. No cardiac history  Review of Systems  Positive:  Negative:   Physical Exam  BP 130/77   Pulse 65   Temp 97.7 F (36.5 C)   Resp 18   Ht 5' 11 (1.803 m)   Wt 72.6 kg   SpO2 100%   BMI 22.32 kg/m  Gen:   Awake, no distress   Resp:  Normal effort  MSK:   Moves extremities without difficulty  Other:    Medical Decision Making  Medically screening exam initiated at 2:36 PM.  Appropriate orders placed.  Erdem Naas was informed that the remainder of the evaluation will be completed by another provider, this initial triage assessment does not replace that evaluation, and the importance of remaining in the ED until their evaluation is complete.     Nora Lauraine LABOR, PA-C 08/27/24 1437

## 2024-08-27 NOTE — ED Triage Notes (Signed)
 Patient arrives POV for palpitations x 24 hours. Patient endorses no hx of same. No associated pain. Patient endorses shortness of breath when palpitations are more frequent. Patient denies fatigue. Patient denies any anticoagulation use.

## 2024-08-27 NOTE — Discharge Instructions (Addendum)
 Your workup in the emergency department today was reassuring.  We recommend follow-up with your primary care doctor.  You would benefit from further work up with a Holter monitor if symptoms persist.  Try to avoid stimulants, such as caffeine, and alcohol.  Get plenty of sleep at night; stress can often worsen episodes of palpitations.  Return to the ED for new or concerning symptoms.

## 2024-08-27 NOTE — ED Provider Notes (Signed)
 " [place] [facility] AT [facility] Provider Note   CSN: [id number] Arrival date & time: 08/27/24  1332     Patient presents with: Palpitations   Randall Nichols is a 39 y.o. male.   39 year old male presents to the emergency department for evaluation of palpitations.  He began to notice palpitations about 24 hours ago.  His symptoms were worse in the night yesterday; are still present, but greatly improved and occurring less frequently.  He describes his palpitations as a skipped beat followed by a strong beat.  This was happening once every few minutes while in the waiting room.  Denies any excessive stimulant use.  Does drink a cup of coffee every morning.  No illicit drug use or other recent medication changes.  Denies recent fever, viral illness, syncope/near syncope, chest pain, hemoptysis or leg swelling.  He has been under increased stress lately.  No family history of sudden cardiac death or cardiac arrhythmia.  The history is provided by the patient. No language interpreter was used.  Palpitations      Prior to Admission medications  Medication Sig Start Date End Date Taking? Authorizing Provider  ibuprofen (ADVIL,MOTRIN) 400 MG tablet Take 400 mg by mouth every 6 (six) hours as needed for fever or mild pain.    [provider]  minocycline (MINOCIN,DYNACIN) 100 MG capsule Take 100 mg by mouth 2 (two) times daily.    [provider]  Multiple Vitamins-Minerals (MULTIVITAMIN WITH MINERALS) tablet Take 1 tablet by mouth daily.    [provider]  polyethylene glycol (GOLYTELY ) 236 G solution Take 4,000 mLs by mouth once. 06/05/15   Vincente Lynwood BIRCH, MD  polyethylene glycol powder (GLYCOLAX /MIRALAX ) powder Take 17 g by mouth 2 (two) times daily. 06/02/15   Muthersbaugh, Chiquita, PA-C    Allergies: Patient has no known allergies.    Review of Systems  Cardiovascular:  Positive for palpitations.  Ten systems reviewed and are negative  for acute change, except as noted in the HPI.    Updated Vital Signs BP (!) 142/83   Pulse 72   Temp 98 F (36.7 C)   Resp 18   Ht 5' 11 (1.803 m)   Wt 72.6 kg   SpO2 100%   BMI 22.32 kg/m   Physical Exam Vitals and nursing note reviewed.  Constitutional:      General: He is not in acute distress.    Appearance: He is well-developed. He is not diaphoretic.     Comments: Nontoxic appearing and in NAD  HENT:     Head: Normocephalic and atraumatic.  Eyes:     General: No scleral icterus.    Conjunctiva/sclera: Conjunctivae normal.  Cardiovascular:     Rate and Rhythm: Regular rhythm. Bradycardia present.     Pulses: Normal pulses.  Pulmonary:     Effort: Pulmonary effort is normal. No respiratory distress.     Breath sounds: No stridor. No wheezing or rales.     Comments: Respirations even and unlabored.  Lungs clear to auscultation bilaterally. Musculoskeletal:        General: Normal range of motion.     Cervical back: Normal range of motion.  Skin:    General: Skin is warm and dry.     Coloration: Skin is not pale.     Findings: No erythema or rash.  Neurological:     Mental Status: He is alert and oriented to person, place, and time.     Coordination: Coordination normal.  Psychiatric:        Behavior: Behavior normal.     (all labs ordered are listed, but only abnormal results are displayed) Labs Reviewed  BASIC METABOLIC PANEL WITH GFR - Abnormal; Notable for the following components:      Result Value   Glucose, Bld 100 (*)    All other components within normal limits  CBC - Abnormal; Notable for the following components:   Platelets 138 (*)    All other components within normal limits  TSH  MAGNESIUM  TROPONIN T, HIGH SENSITIVITY  TROPONIN T, HIGH SENSITIVITY    EKG: EKG Interpretation Date/Time:  Friday August 27 2024 13:43:02 EST Ventricular Rate:  64 PR Interval:  126 QRS Duration:  104 QT Interval:  400 QTC Calculation: 412 R  Axis:   72  Text Interpretation: Normal sinus rhythm with sinus arrhythmia Normal ECG No previous ECGs available Confirmed by Theadore Sharper [id number]) on 08/27/2024 11:36:35 PM  Radiology: [facility] Chest 2 View Result Date: 08/27/2024 CLINICAL DATA:  Shortness of breath. EXAM: CHEST - 2 VIEW COMPARISON:  June 02, 2015 FINDINGS: The heart size and mediastinal contours are within normal limits. Both lungs are clear. The visualized skeletal structures are unremarkable. IMPRESSION: No active cardiopulmonary disease. Electronically Signed   By: Suzen Dials M.D.   On: 08/27/2024 15:26     Procedures   Medications Ordered in the ED - No data to display                                  Medical Decision Making Amount and/or Complexity of Data Reviewed Labs: ordered. Radiology: ordered.   This patient presents to the ED for concern of palpitations, this involves an extensive number of treatment options, and is a complaint that carries with it a high risk of complications and morbidity.  The differential diagnosis includes anxiety vs electrolyte abnormality vs arrhythmia vs thyroid  dysfunction vs toxicity vs substance use   Co morbidities that complicate the patient evaluation  None known   Additional history obtained:  Additional history obtained from medical records External records from outside source obtained and reviewed including prior discharge summaries   Lab Tests:  I Ordered, and personally interpreted labs.  The pertinent results include:  Negative troponin x2. No electrolyte abnormalities. Normal magnesium. Normal TSH.   Imaging Studies ordered:  I ordered imaging studies including CXR  I independently visualized and interpreted imaging which showed no acute cardiopulmonary abnormality I agree with the radiologist interpretation   Cardiac Monitoring:  The patient was maintained on a cardiac monitor.  I personally viewed and interpreted the cardiac monitored which  showed an underlying rhythm of: Sinus bradycardia   Medicines ordered and prescription drug management:  I have reviewed the patients home medicines and have made adjustments as needed   Test Considered:  UDS   Problem List / ED Course:  Patient presenting for palpitations.  Onset was 24 hours ago.  His symptoms are spontaneously improving.   No associated chest pain.  Doubt ischemic cardiac etiology.  He has a negative troponin x 2.  EKG is reassuring. There is no evidence of concerning arrhythmia on EKG today.  No findings consistent with Brugada. Labs reassuring as well.  Electrolytes within normal limits.  Thyroid  function is normal. From discussion with the patient about his symptoms, suspect that he is experiencing episodes of PVCs versus PACs.  This may be stress-induced.  Have  counseled patient on limiting caffeine intake as well as alcohol use.  Would benefit from further workup with outpatient Holter monitor.  Do not feel further emergent workup is presently indicated.   Reevaluation:  After the interventions noted above, I reevaluated the patient and found that they have :stayed the same   Social Determinants of Health:  Insured patient   Dispostion:  After consideration of the diagnostic results and the patients response to treatment, I feel that the patent would benefit from outpatient follow-up with PCP. Return precautions discussed and provided. Patient discharged in stable condition with no unaddressed concerns.       Final diagnoses:  Palpitations    ED Discharge Orders     None          Keith Sor, [place] 08/27/24 2354  "
